# Patient Record
Sex: Female | Born: 1960 | Race: White | Hispanic: No | Marital: Married | Smoking: Never smoker
Health system: Southern US, Community
[De-identification: ages and names within clinical notes are randomized; demographics above are authoritative.]

## PROBLEM LIST (undated history)

## (undated) DIAGNOSIS — J984 Other disorders of lung: Secondary | ICD-10-CM

## (undated) DIAGNOSIS — I10 Essential (primary) hypertension: Secondary | ICD-10-CM

## (undated) HISTORY — PX: TOTAL HIP ARTHROPLASTY: SHX124

---

## 2014-10-28 ENCOUNTER — Emergency Department (HOSPITAL_BASED_OUTPATIENT_CLINIC_OR_DEPARTMENT_OTHER): Payer: BLUE CROSS/BLUE SHIELD

## 2014-10-28 ENCOUNTER — Emergency Department (HOSPITAL_BASED_OUTPATIENT_CLINIC_OR_DEPARTMENT_OTHER)
Admission: EM | Admit: 2014-10-28 | Discharge: 2014-10-28 | Disposition: A | Discharge status: Home / Self Care | Payer: BLUE CROSS/BLUE SHIELD | Attending: Emergency Medicine | Admitting: Emergency Medicine

## 2014-10-28 ENCOUNTER — Encounter (HOSPITAL_BASED_OUTPATIENT_CLINIC_OR_DEPARTMENT_OTHER): Payer: Self-pay | Admitting: *Deleted

## 2014-10-28 DIAGNOSIS — I10 Essential (primary) hypertension: Secondary | ICD-10-CM | POA: Insufficient documentation

## 2014-10-28 DIAGNOSIS — Y998 Other external cause status: Secondary | ICD-10-CM | POA: Insufficient documentation

## 2014-10-28 DIAGNOSIS — Y9289 Other specified places as the place of occurrence of the external cause: Secondary | ICD-10-CM | POA: Diagnosis not present

## 2014-10-28 DIAGNOSIS — Z7951 Long term (current) use of inhaled steroids: Secondary | ICD-10-CM | POA: Insufficient documentation

## 2014-10-28 DIAGNOSIS — W19XXXA Unspecified fall, initial encounter: Secondary | ICD-10-CM

## 2014-10-28 DIAGNOSIS — Y9389 Activity, other specified: Secondary | ICD-10-CM | POA: Diagnosis not present

## 2014-10-28 DIAGNOSIS — Z79899 Other long term (current) drug therapy: Secondary | ICD-10-CM | POA: Diagnosis not present

## 2014-10-28 DIAGNOSIS — W108XXA Fall (on) (from) other stairs and steps, initial encounter: Secondary | ICD-10-CM | POA: Insufficient documentation

## 2014-10-28 DIAGNOSIS — S7001XA Contusion of right hip, initial encounter: Secondary | ICD-10-CM | POA: Diagnosis not present

## 2014-10-28 DIAGNOSIS — S5011XA Contusion of right forearm, initial encounter: Secondary | ICD-10-CM | POA: Insufficient documentation

## 2014-10-28 DIAGNOSIS — S59911A Unspecified injury of right forearm, initial encounter: Secondary | ICD-10-CM | POA: Diagnosis present

## 2014-10-28 DIAGNOSIS — T07XXXA Unspecified multiple injuries, initial encounter: Secondary | ICD-10-CM

## 2014-10-28 HISTORY — DX: Essential (primary) hypertension: I10

## 2014-10-28 MED ORDER — IBUPROFEN 800 MG PO TABS: 800.0000 mg | ORAL_TABLET | Freq: Three times a day (TID) | ORAL | Status: AC | PRN

## 2014-10-28 MED ORDER — HYDROCODONE-ACETAMINOPHEN 5-325 MG PO TABS: 1.0000 | ORAL_TABLET | ORAL | Status: DC | PRN

## 2014-10-28 NOTE — ED Notes (Signed)
Care assumed at d/c. Pt seen by EDP prior to RN assessment, see MD notes, orders to d/c received and initiated. Xray results noted.

## 2014-10-28 NOTE — ED Provider Notes (Signed)
TIME SEEN: 1:45 AM  CHIEF COMPLAINT: Fall, right arm pain  HPI: Pt is a 54 y.o. left-hand dominant female with history of hypertension who presents emergency department after she had a fall down 2 stairs at home landing on her right hip and right arm. Denies hitting her head or losing consciousness. States she was wearing socks and walking down stairs that were hardwood and slipped. Not on anticoagulation. Denies numbness, tingling or focal weakness. No chest or abdominal pain. No neck or back pain. States that she feels that the posterior right hip pain is only on the skin because she had her cell phone in her back pocket. States her right forearm was more swollen that with ice in the emergency department it is improving. She denies any significant pain. Denies pain medication at this time.  ROS: See HPI Constitutional: no fever  Eyes: no drainage  ENT: no runny nose   Cardiovascular:  no chest pain  Resp: no SOB  GI: no vomiting GU: no dysuria Integumentary: no rash  Allergy: no hives  Musculoskeletal: no leg swelling  Neurological: no slurred speech ROS otherwise negative  PAST MEDICAL HISTORY/PAST SURGICAL HISTORY:  Past Medical History  Diagnosis Date  . Hypertension     MEDICATIONS:  Prior to Admission medications   Medication Sig Start Date End Date Taking? Authorizing Provider  Fluticasone-Salmeterol (ADVAIR) 500-50 MCG/DOSE AEPB Inhale 1 puff into the lungs 2 (two) times daily.   Yes Historical Provider, MD  metoprolol succinate (TOPROL-XL) 50 MG 24 hr tablet Take 50 mg by mouth daily. Take with or immediately following a meal.   Yes Historical Provider, MD  sertraline (ZOLOFT) 50 MG tablet Take 50 mg by mouth daily.   Yes Historical Provider, MD  valsartan-hydrochlorothiazide (DIOVAN-HCT) 320-25 MG per tablet Take 1 tablet by mouth daily.   Yes Historical Provider, MD  HYDROcodone-acetaminophen (NORCO/VICODIN) 5-325 MG per tablet Take 1 tablet by mouth every 4 (four) hours  as needed. 10/28/14   Kristen N Ward, DO  ibuprofen (ADVIL,MOTRIN) 800 MG tablet Take 1 tablet (800 mg total) by mouth every 8 (eight) hours as needed for mild pain. 10/28/14   Kristen N Ward, DO    ALLERGIES:  No Known Allergies  SOCIAL HISTORY:  History  Substance Use Topics  . Smoking status: Never Smoker   . Smokeless tobacco: Not on file  . Alcohol Use: No    FAMILY HISTORY: History reviewed. No pertinent family history.  EXAM: BP 140/89 mmHg  Pulse 60  Temp(Src) 97.7 F (36.5 C) (Oral)  Resp 18  Ht 5\' 4"  (1.626 m)  Wt 180 lb (81.647 kg)  BMI 30.88 kg/m2  SpO2 95% CONSTITUTIONAL: Alert and oriented and responds appropriately to questions. Well-appearing; well-nourished; GCS 15 HEAD: Normocephalic; atraumatic EYES: Conjunctivae clear, PERRL, EOMI ENT: normal nose; no rhinorrhea; moist mucous membranes; pharynx without lesions noted; no dental injury;  no septal hematoma NECK: Supple, no meningismus, no LAD; no midline spinal tenderness, step-off or deformity CARD: RRR; S1 and S2 appreciated; no murmurs, no clicks, no rubs, no gallops RESP: Normal chest excursion without splinting or tachypnea; breath sounds clear and equal bilaterally; no wheezes, no rhonchi, no rales; chest wall stable, nontender to palpation ABD/GI: Normal bowel sounds; non-distended; soft, non-tender, no rebound, no guarding PELVIS:  stable, nontender to palpation BACK:  The back appears normal and is non-tender to palpation, there is no CVA tenderness; no midline spinal tenderness, step-off or deformity EXT: Small abrasion and hematoma to the posterior right  hip but normal range of motion in this joint and no bony tenderness or joint effusion, patient does have a contusion with ecchymosis and very mild swelling to the right ulnar aspect of the midforearm without bony deformity, no tenderness over the wrist, patient has some tenderness over the right elbow with full range of motion, 2+ radial pulses  bilaterally, sensation to light touch intact diffusely, compartment soft, no joint effusions, Normal ROM in all joints; otherwise extremities are non-tender to palpation; no edema; normal capillary refill; no cyanosis    SKIN: Normal color for age and race; warm NEURO: Moves all extremities equally; sensation to light touch intact diffusely, cranial nerves II through XII intact, normal gait PSYCH: The patient's mood and manner are appropriate. Grooming and personal hygiene are appropriate.  MEDICAL DECISION MAKING: Patient here with mechanical fall. No head injury. Hemodynamically stable. Neurovascularly intact. Complains that she was concerned about injury to the right arm. She does have a abrasion and small hematoma to the right hip but she declines x-rays states she feels that this is only an abrasion. X-ray of the right elbow and forearm have been reviewed by myself and radiology and we do not see any fracture or dislocation. She denies wanting pain medication in the ED. We'll discharge with ibuprofen and Vicodin. Discussed return precautions and importance of PCP follow-up if symptoms continue after one week of medical management. She verbalized understanding and is comfortable with this plan.       Layla Maw Ward, DO 10/28/14 0230

## 2014-10-28 NOTE — ED Notes (Signed)
Pt reports falling down x2 stairs at home approx 1hr pta - states she slipped while wearing socks - fell onto rt FA and now c/o rt FA pain, contusion and swelling noted to posterior FA.

## 2014-10-28 NOTE — ED Notes (Signed)
Pt alert, NAD, calm, interactive, resps e/u, speaking in clear complete sentences, sitting in chair with s.o. And R FA on ice. Redness and small bruise noted to R FA.

## 2014-10-28 NOTE — Discharge Instructions (Signed)
Contusion °A contusion is a deep bruise. Contusions are the result of an injury that caused bleeding under the skin. The contusion may turn blue, purple, or yellow. Minor injuries will give you a painless contusion, but more severe contusions may stay painful and swollen for a few weeks.  °CAUSES  °A contusion is usually caused by a blow, trauma, or direct force to an area of the body. °SYMPTOMS  °· Swelling and redness of the injured area. °· Bruising of the injured area. °· Tenderness and soreness of the injured area. °· Pain. °DIAGNOSIS  °The diagnosis can be made by taking a history and physical exam. An X-ray, CT scan, or MRI may be needed to determine if there were any associated injuries, such as fractures. °TREATMENT  °Specific treatment will depend on what area of the body was injured. In general, the best treatment for a contusion is resting, icing, elevating, and applying cold compresses to the injured area. Over-the-counter medicines may also be recommended for pain control. Ask your caregiver what the best treatment is for your contusion. °HOME CARE INSTRUCTIONS  °· Put ice on the injured area. °· Put ice in a plastic bag. °· Place a towel between your skin and the bag. °· Leave the ice on for 15-20 minutes, 3-4 times a day, or as directed by your health care provider. °· Only take over-the-counter or prescription medicines for pain, discomfort, or fever as directed by your caregiver. Your caregiver may recommend avoiding anti-inflammatory medicines (aspirin, ibuprofen, and naproxen) for 48 hours because these medicines may increase bruising. °· Rest the injured area. °· If possible, elevate the injured area to reduce swelling. °SEEK IMMEDIATE MEDICAL CARE IF:  °· You have increased bruising or swelling. °· You have pain that is getting worse. °· Your swelling or pain is not relieved with medicines. °MAKE SURE YOU:  °· Understand these instructions. °· Will watch your condition. °· Will get help right  away if you are not doing well or get worse. °Document Released: 06/02/2005 Document Revised: 08/28/2013 Document Reviewed: 06/28/2011 °[employer]® Patient Information ©2015 [employer]. This information is not intended to replace advice given to you by your health care provider. Make sure you discuss any questions you have with your health care provider. °RICE: Routine Care for Injuries °The routine care of many injuries includes Rest, Ice, Compression, and Elevation (RICE). °HOME CARE INSTRUCTIONS °· Rest is needed to allow your body to heal. Routine activities can usually be resumed when comfortable. Injured tendons and bones can take up to 6 weeks to heal. Tendons are the cord-like structures that attach muscle to bone. °· Ice following an injury helps keep the swelling down and reduces pain. °¨ Put ice in a plastic bag. °¨ Place a towel between your skin and the bag. °¨ Leave the ice on for 15-20 minutes, 3-4 times a day, or as directed by your health care provider. Do this while awake, for the first 24 to 48 hours. After that, continue as directed by your caregiver. °· Compression helps keep swelling down. It also gives support and helps with discomfort. If an elastic bandage has been applied, it should be removed and reapplied every 3 to 4 hours. It should not be applied tightly, but firmly enough to keep swelling down. Watch fingers or toes for swelling, bluish discoloration, coldness, numbness, or excessive pain. If any of these problems occur, remove the bandage and reapply loosely. Contact your caregiver if these problems continue. °· Elevation helps reduce swelling   and decreases pain. With extremities, such as the arms, hands, legs, and feet, the injured area should be placed near or above the level of the heart, if possible. °SEEK IMMEDIATE MEDICAL CARE IF: °· You have persistent pain and swelling. °· You develop redness, numbness, or unexpected weakness. °· Your symptoms are getting worse rather than  improving after several days. °These symptoms may indicate that further evaluation or further X-rays are needed. Sometimes, X-rays may not show a small broken bone (fracture) until 1 week or 10 days later. Make a follow-up appointment with your caregiver. Ask when your X-ray results will be ready. Make sure you get your X-ray results. °Document Released: 12/05/2000 Document Revised: 08/28/2013 Document Reviewed: 01/22/2011 °ExitCare® Patient Information ©2015 [employer]. This information is not intended to replace advice given to you by your health care provider. Make sure you discuss any questions you have with your health care provider. ° °

## 2016-10-06 ENCOUNTER — Emergency Department (HOSPITAL_BASED_OUTPATIENT_CLINIC_OR_DEPARTMENT_OTHER): Payer: BLUE CROSS/BLUE SHIELD

## 2016-10-06 ENCOUNTER — Emergency Department (HOSPITAL_BASED_OUTPATIENT_CLINIC_OR_DEPARTMENT_OTHER)
Admission: EM | Admit: 2016-10-06 | Discharge: 2016-10-06 | Disposition: A | Discharge status: Home / Self Care | Payer: BLUE CROSS/BLUE SHIELD | Attending: Emergency Medicine | Admitting: Emergency Medicine

## 2016-10-06 ENCOUNTER — Encounter (HOSPITAL_BASED_OUTPATIENT_CLINIC_OR_DEPARTMENT_OTHER): Payer: Self-pay

## 2016-10-06 DIAGNOSIS — Z791 Long term (current) use of non-steroidal anti-inflammatories (NSAID): Secondary | ICD-10-CM | POA: Insufficient documentation

## 2016-10-06 DIAGNOSIS — Z79899 Other long term (current) drug therapy: Secondary | ICD-10-CM | POA: Insufficient documentation

## 2016-10-06 DIAGNOSIS — R0602 Shortness of breath: Secondary | ICD-10-CM | POA: Diagnosis not present

## 2016-10-06 DIAGNOSIS — J111 Influenza due to unidentified influenza virus with other respiratory manifestations: Secondary | ICD-10-CM

## 2016-10-06 DIAGNOSIS — I10 Essential (primary) hypertension: Secondary | ICD-10-CM | POA: Diagnosis not present

## 2016-10-06 DIAGNOSIS — R69 Illness, unspecified: Secondary | ICD-10-CM

## 2016-10-06 DIAGNOSIS — R110 Nausea: Secondary | ICD-10-CM | POA: Diagnosis not present

## 2016-10-06 DIAGNOSIS — R51 Headache: Secondary | ICD-10-CM | POA: Diagnosis present

## 2016-10-06 DIAGNOSIS — R509 Fever, unspecified: Secondary | ICD-10-CM | POA: Insufficient documentation

## 2016-10-06 MED ORDER — ALBUTEROL SULFATE HFA 108 (90 BASE) MCG/ACT IN AERS
INHALATION_SPRAY | RESPIRATORY_TRACT | Status: AC
  Filled 2016-10-06: qty 6.7

## 2016-10-06 MED ORDER — ALBUTEROL SULFATE HFA 108 (90 BASE) MCG/ACT IN AERS
2.0000 | INHALATION_SPRAY | RESPIRATORY_TRACT | Status: DC | PRN
  Administered 2016-10-06: 2 via RESPIRATORY_TRACT

## 2016-10-06 MED ORDER — DOXYCYCLINE HYCLATE 100 MG PO CAPS: 100.0000 mg | ORAL_CAPSULE | Freq: Two times a day (BID) | ORAL | 0 refills | Status: AC

## 2016-10-06 MED ORDER — DOXYCYCLINE HYCLATE 100 MG PO TABS
100.0000 mg | ORAL_TABLET | Freq: Once | ORAL | Status: AC
  Administered 2016-10-06: 100 mg via ORAL
  Filled 2016-10-06: qty 1

## 2016-10-06 MED ORDER — OSELTAMIVIR PHOSPHATE 75 MG PO CAPS: 75.0000 mg | ORAL_CAPSULE | Freq: Two times a day (BID) | ORAL | 0 refills | Status: DC

## 2016-10-06 NOTE — ED Triage Notes (Addendum)
C/o body aches, chills, fever x today-feeling like she "couldn't take a deep enough breath"-NAD-steady gait-last dose advil 9pm-states hx of lungs compromised "from too much mucus"-sees pulmonologist-takes singulair

## 2016-10-06 NOTE — ED Provider Notes (Signed)
[facility] Provider Note: [name], MD, FACEP  By signing my name below, I, Bing Neighbors., attest that this documentation has been prepared under the direction and in the presence of Paula Libra, MD. Electronically signed: Bing Neighbors., ED Scribe. 10/06/16. 11:43 PM.   CSN: [id number] MRN: [id number] ARRIVAL: 10/06/16 at 2209 ROOM: [facility]   CHIEF COMPLAINT  Generalized Body Aches   HISTORY OF PRESENT ILLNESS   HPI Comments: Beth Gonzalez is a 56 y.o. female with hx of lung disease who presents to the Emergency Department complaining of moderate myalgias with sudden onset today. Pt states that she woke up this morning with a headache. She states that she has also had fever (100.1 at home, 100.3 here) and chills today. Pt has taken Motrin with partial relief. Pt suspect that she has the flu. She states that her chest has felt heavier today and it is more difficult to take a deep breath than usual. She reports mild SOB, nausea, headache. She denies vomiting, diarrhea.     Past Medical History:  Diagnosis Date  . Hypertension     Past Surgical History:  Procedure Laterality Date  . TOTAL HIP ARTHROPLASTY      No family history on file.  Social History  Substance Use Topics  . Smoking status: Never Smoker  . Smokeless tobacco: Never Used  . Alcohol use No    Prior to Admission medications   Medication Sig Start Date End Date Taking? Authorizing Provider  doxycycline (VIBRAMYCIN) 100 MG capsule Take 1 capsule (100 mg total) by mouth 2 (two) times daily. One po bid x 7 days 10/06/16   Paula Libra, MD  Fluticasone-Salmeterol (ADVAIR) 500-50 MCG/DOSE AEPB Inhale 1 puff into the lungs 2 (two) times daily.    Historical Provider, MD  ibuprofen (ADVIL,MOTRIN) 800 MG tablet Take 1 tablet (800 mg total) by mouth every 8 (eight) hours as needed for mild pain. 10/28/14   Kristen N Ward, DO  metoprolol succinate (TOPROL-XL) 50 MG 24 hr tablet  Take 50 mg by mouth daily. Take with or immediately following a meal.    Historical Provider, MD  oseltamivir (TAMIFLU) 75 MG capsule Take 1 capsule (75 mg total) by mouth 2 (two) times daily. 10/06/16   Dawit Tankard, MD  sertraline (ZOLOFT) 50 MG tablet Take 50 mg by mouth daily.    Historical Provider, MD    Allergies Patient has no known allergies.   REVIEW OF SYSTEMS  Negative except as noted here or in the History of Present Illness.   PHYSICAL EXAMINATION  Initial Vital Signs Blood pressure 121/82, pulse 88, temperature 100.3 F (37.9 C), temperature source Oral, resp. rate 18, height 5\' 4"  (1.626 m), weight 165 lb (74.8 kg), SpO2 94 %.  Examination General: Well-developed, well-nourished female in no acute distress; appearance consistent with age of record HENT: normocephalic; atraumatic Eyes: pupils equal, round and reactive to light; extraocular muscles intact Neck: supple Heart: regular rate and rhythm Lungs: Mildly decreased air movement bilaterally Abdomen: soft; nondistended; nontender; no masses or hepatosplenomegaly; bowel sounds present Extremities: No deformity; full range of motion; pulses normal Neurologic: Awake, alert and oriented; motor function intact in all extremities and symmetric; no facial droop Skin: Warm and dry Psychiatric: Normal mood and affect   RESULTS  Summary of this visit's results, reviewed by myself:   EKG Interpretation  Date/Time:    Ventricular Rate:    PR Interval:    QRS Duration:  QT Interval:    QTC Calculation:   R Axis:     Text Interpretation:        Laboratory Studies: No results found for this or any previous visit (from the past 24 hour(s)). Imaging Studies: Dg Chest 2 View  Result Date: 10/06/2016 CLINICAL DATA:  Acute onset of fever.  Initial encounter. EXAM: CHEST  2 VIEW COMPARISON:  Chest radiograph from 01/01/2016 FINDINGS: The lungs are well-aerated. Vague bilateral lower lung zone and left midlung  airspace opacities raise concern for pneumonia. There is no evidence of pleural effusion or pneumothorax. The heart is normal in size; the mediastinal contour is within normal limits. No acute osseous abnormalities are seen. IMPRESSION: Vague bilateral lower lung zone and left mid lung zone airspace opacities raise concern for pneumonia. Electronically Signed   By: Roanna RaiderJeffery  Chang M.D.   On: 10/06/2016 22:51    ED COURSE  Nursing notes and initial vitals signs, including pulse oximetry, reviewed.  Vitals:   10/06/16 2224 10/06/16 2233  BP: 121/82   Pulse: 88   Resp: 18   Temp: 100.3 F (37.9 C)   TempSrc: Oral   SpO2: 94%   Weight:  165 lb (74.8 kg)  Height:  5\' 4"  (1.626 m)   11:42 PM We'll treat for both influenza and pneumonia given patient's baseline lung disease. She does not currently have an albuterol inhaler and we will provide her one.  PROCEDURES    ED DIAGNOSES     ICD-9-CM ICD-10-CM   1. Influenza-like illness 799.89 R69    I personally performed the services described in this documentation, which was scribed in my presence. The recorded information has been reviewed and is accurate.     Paula LibraJohn Elie Gragert, MD 10/06/16 [phone]

## 2016-11-25 ENCOUNTER — Institutional Professional Consult (permissible substitution): Payer: BLUE CROSS/BLUE SHIELD | Admitting: Internal Medicine

## 2021-07-29 ENCOUNTER — Encounter (HOSPITAL_BASED_OUTPATIENT_CLINIC_OR_DEPARTMENT_OTHER): Payer: Self-pay

## 2021-07-29 ENCOUNTER — Emergency Department (HOSPITAL_BASED_OUTPATIENT_CLINIC_OR_DEPARTMENT_OTHER)
Admission: EM | Admit: 2021-07-29 | Discharge: 2021-07-29 | Disposition: A | Discharge status: Home / Self Care | Payer: Commercial Managed Care - PPO | Attending: Emergency Medicine | Admitting: Emergency Medicine

## 2021-07-29 ENCOUNTER — Other Ambulatory Visit: Payer: Self-pay

## 2021-07-29 ENCOUNTER — Emergency Department (HOSPITAL_BASED_OUTPATIENT_CLINIC_OR_DEPARTMENT_OTHER): Payer: Commercial Managed Care - PPO

## 2021-07-29 ENCOUNTER — Other Ambulatory Visit (HOSPITAL_BASED_OUTPATIENT_CLINIC_OR_DEPARTMENT_OTHER): Payer: Self-pay

## 2021-07-29 DIAGNOSIS — I10 Essential (primary) hypertension: Secondary | ICD-10-CM | POA: Diagnosis not present

## 2021-07-29 DIAGNOSIS — R059 Cough, unspecified: Secondary | ICD-10-CM | POA: Diagnosis present

## 2021-07-29 DIAGNOSIS — Z79899 Other long term (current) drug therapy: Secondary | ICD-10-CM | POA: Diagnosis not present

## 2021-07-29 DIAGNOSIS — Z20822 Contact with and (suspected) exposure to covid-19: Secondary | ICD-10-CM | POA: Insufficient documentation

## 2021-07-29 DIAGNOSIS — J101 Influenza due to other identified influenza virus with other respiratory manifestations: Secondary | ICD-10-CM | POA: Diagnosis not present

## 2021-07-29 DIAGNOSIS — Z96649 Presence of unspecified artificial hip joint: Secondary | ICD-10-CM | POA: Diagnosis not present

## 2021-07-29 DIAGNOSIS — R051 Acute cough: Secondary | ICD-10-CM

## 2021-07-29 HISTORY — DX: Other disorders of lung: J98.4

## 2021-07-29 LAB — RESP PANEL BY RT-PCR (FLU A&B, COVID) ARPGX2
Influenza A by PCR: POSITIVE — AB
Influenza B by PCR: NEGATIVE
SARS Coronavirus 2 by RT PCR: NEGATIVE

## 2021-07-29 MED ORDER — OSELTAMIVIR PHOSPHATE 75 MG PO CAPS
75.0000 mg | ORAL_CAPSULE | Freq: Two times a day (BID) | ORAL | 0 refills | Status: AC
  Filled 2021-07-29: qty 10, 5d supply, fill #0

## 2021-07-29 NOTE — ED Notes (Signed)
X-ray at bedside

## 2021-07-29 NOTE — ED Notes (Signed)
D/c paperwork reviewed with pt and pts spouse. Pt verbalized understanding, no questions or concerns at time of d/c.  Pt declined offer for wheelchair, feels comfortable to walk out of dept. NAD, on RA.

## 2021-07-29 NOTE — ED Provider Notes (Signed)
[facility] EMERGENCY DEPARTMENT Provider Note   CSN: [id number] Arrival date & time: 07/29/21  1137     History Chief Complaint  Patient presents with   Cough    Beth Gonzalez is a 60 y.o. female.  60 y.o female with a past medical history of hypertension, lung disease presents to the ED with a chief complaint of flulike symptoms for the past 3 days.  Patient did not have her flu vaccine this season.  Endorses cough, has had some improvement with inhaler at home but is concerned for other involvement.  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  She denies any ornis of breath, chest pain.  She recently traveled from [place], states she might have caught "the common cold "from someone else.  She denies any fever, nausea, vomiting.  No prior history of blood clots.  The history is provided by the patient and medical records.  Cough Cough characteristics:  Productive Sputum characteristics:  Nondescript Onset quality:  Sudden Duration:  3 days Progression:  Worsening Chronicity:  New Smoker: no   Associated symptoms: no chest pain, no fever, no rhinorrhea and no sore throat       Past Medical History:  Diagnosis Date   Hypertension    Lung disease     There are no problems to display for this patient.   Past Surgical History:  Procedure Laterality Date   CESAREAN SECTION     TOTAL HIP ARTHROPLASTY       OB History   No obstetric history on file.     No family history on file.  Social History   Tobacco Use   Smoking status: Never   Smokeless tobacco: Never  Vaping Use   Vaping Use: Never used  Substance Use Topics   Alcohol use: Yes    Comment: rare   Drug use: No    Home Medications Prior to Admission medications   Medication Sig Start Date End Date Taking? Authorizing Provider  doxycycline (VIBRAMYCIN) 100 MG  capsule Take 1 capsule (100 mg total) by mouth 2 (two) times daily. One po bid x 7 days 10/06/16   Molpus, John, MD  Fluticasone-Salmeterol (ADVAIR) 500-50 MCG/DOSE AEPB Inhale 1 puff into the lungs 2 (two) times daily.    [provider]  ibuprofen (ADVIL,MOTRIN) 800 MG tablet Take 1 tablet (800 mg total) by mouth every 8 (eight) hours as needed for mild pain. 10/28/14   Ward, Layla Maw, DO  metoprolol succinate (TOPROL-XL) 50 MG 24 hr tablet Take 50 mg by mouth daily. Take with or immediately following a meal.    [provider]  oseltamivir (TAMIFLU) 75 MG capsule Take 1 capsule (75 mg total) by mouth 2 (two) times daily for 5 days. 07/29/21 08/03/21 Yes Shahab Polhamus, Leonie Douglas, PA-C  sertraline (ZOLOFT) 50 MG tablet Take 50 mg by mouth daily.    [provider]    Allergies    Patient has no known allergies.  Review of Systems   Review of Systems  Constitutional:  Negative for fever.  HENT:  Negative for rhinorrhea, sinus pain and sore throat.   Respiratory:  Positive for cough.   Cardiovascular:  Negative for chest pain.  Gastrointestinal:  Negative for abdominal pain.   Physical Exam Updated Vital Signs BP 130/85 (BP Location: Left Arm)   Pulse 74   Temp 98.8 F (  37.1 C) (Oral)   Resp 18   Ht 5\' 3"  (1.6 m)   Wt 71.2 kg   SpO2 95%   BMI 27.81 kg/m   Physical Exam Vitals and nursing note reviewed.  Constitutional:      Appearance: Normal appearance.  HENT:     Head: Normocephalic and atraumatic.     Mouth/Throat:     Mouth: Mucous membranes are moist.     Comments: No bilateral exudates, oropharynx is clear, without any PTA noted. Cardiovascular:     Rate and Rhythm: Normal rate.  Pulmonary:     Comments: Lungs are clear to auscultation without any wheezing, rhonchi, rales. Abdominal:     General: Abdomen is flat.  Musculoskeletal:     Cervical back: Normal range of motion and neck supple.  Neurological:     Mental Status: She is alert and oriented  to person, place, and time.    ED Results / Procedures / Treatments   Labs (all labs ordered are listed, but only abnormal results are displayed) Labs Reviewed  RESP PANEL BY RT-PCR (FLU A&B, COVID) ARPGX2 - Abnormal; Notable for the following components:      Result Value   Influenza A by PCR POSITIVE (*)    All other components within normal limits    EKG None  Radiology No results found.  Procedures Procedures   Medications Ordered in ED Medications - No data to display  ED Course  I have reviewed the triage vital signs and the nursing notes.  Pertinent labs & imaging results that were available during my care of the patient were reviewed by me and considered in my medical decision making (see chart for details).  Clinical Course as of 07/29/21 1309  Wed Jul 29, 2021  1254 Influenza A By PCR(!): POSITIVE [JS]    Clinical Course User Index [JS] Jul 31, 2021, PA-C   MDM Rules/Calculators/A&P   Patient presents to the ED with a chief complaint of cough, flulike symptoms for the past 3 days, did not have any flu vaccine this season.  Vitals are within normal limits, oxygen saturation is 95% without any tachycardia.  No prior history of blood clots such as PE or DVT in the past.  Her exam is more still benign.  With lung clear to auscultation, no wheezing rhonchi or rales.  We did discuss Tamiflu, risks and benefits were reviewed at length with her and her husband at the bedside.  She does report taking this in the past which helped significantly with her disease process.  Her influenza test on today's visit is positive.  She is requesting a prescription for Tamiflu at this time although symptoms are very mild.  This was given at her request, she understands and agrees with risks and benefits.  X-ray obtained to rule out any pneumonia, reviewed and interpreted by me without any acute finding at this time.  Patient with stable vital signs without any tachycardia or hypoxia  stable for discharge.   Portions of this note were generated with Claude Manges. Dictation errors may occur despite best attempts at proofreading.  Final Clinical Impression(s) / ED Diagnoses Final diagnoses:  Acute cough  Influenza A    Rx / DC Orders ED Discharge Orders          Ordered    oseltamivir (TAMIFLU) 75 MG capsule  2 times daily        07/29/21 1306  Claude Manges, PA-C 07/29/21 1309    Vanetta Mulders, MD 07/29/21 1515

## 2021-07-29 NOTE — Discharge Instructions (Addendum)
You were given a prescription for Tamiflu today. We discussed the risk and benefits at length during your visit.  Your test was positive for influenza A.  You will need to take 1 tablet twice a day for the next 5 days.

## 2021-07-29 NOTE — ED Triage Notes (Signed)
Pt c/o flu like sx day 3-NAD-steady gait 

## 2022-11-15 IMAGING — DX DG CHEST 1V PORT
2 series · 2 of 2 positions shown · non-contrast
Comparison: Chest x-ray dated December 26, 2019.

CLINICAL DATA: Flu like symptoms for the past 3 days.

EXAM:
PORTABLE CHEST 1 VIEW

[chest ap (1 of 2)]
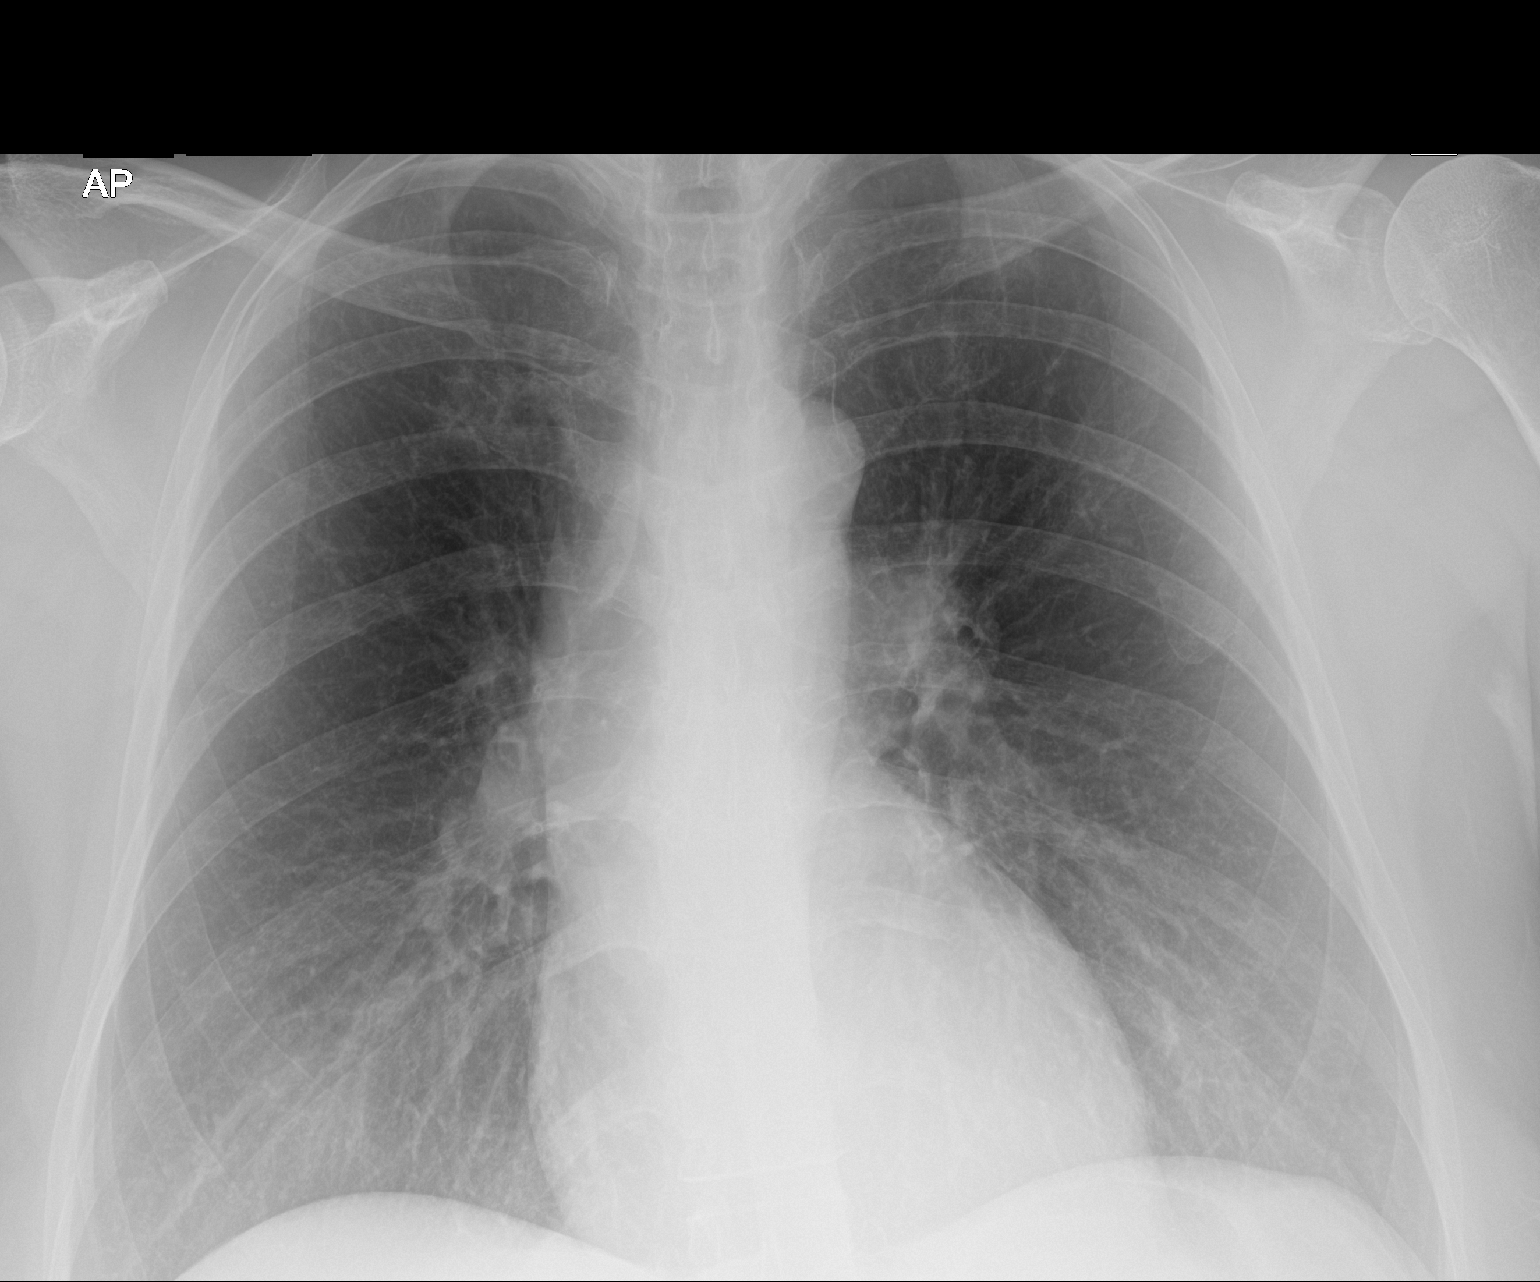

[chest ap (2 of 2)]
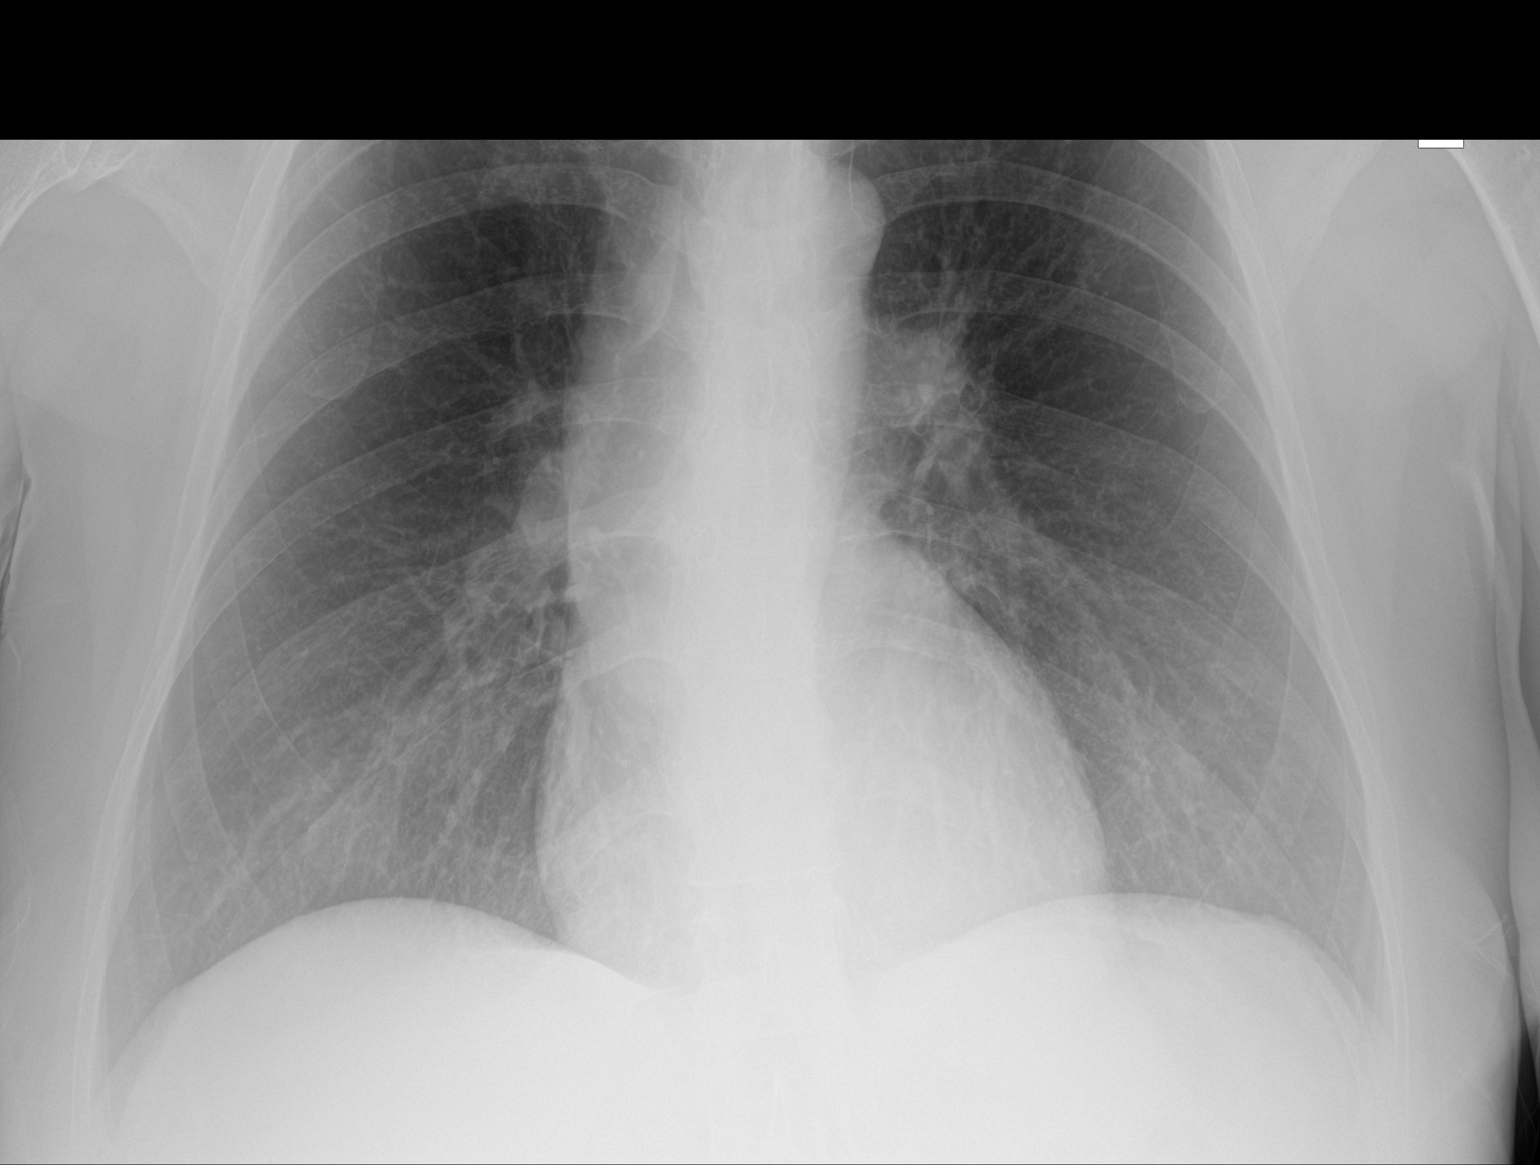

[2 of 2 positions shown; findings below may reference images not displayed]

FINDINGS: The heart size and mediastinal contours are within normal limits.
Both lungs are clear. The visualized skeletal structures are
unremarkable.
IMPRESSION: No active disease.
